# Patient Record
Sex: Female | Born: 1975 | Race: White | Hispanic: No | Marital: Single
Health system: Southern US, Community
[De-identification: ages and names within clinical notes are randomized; demographics above are authoritative.]

---

## 2016-03-06 ENCOUNTER — Other Ambulatory Visit: Payer: Self-pay | Admitting: Obstetrics and Gynecology

## 2016-03-06 DIAGNOSIS — Z1231 Encounter for screening mammogram for malignant neoplasm of breast: Secondary | ICD-10-CM

## 2016-04-17 ENCOUNTER — Ambulatory Visit
Admission: RE | Admit: 2016-04-17 | Discharge: 2016-04-17 | Disposition: A | Discharge status: Home / Self Care | Payer: Managed Care, Other (non HMO) | Source: Ambulatory Visit | Attending: Obstetrics and Gynecology | Admitting: Obstetrics and Gynecology

## 2016-04-17 DIAGNOSIS — Z1231 Encounter for screening mammogram for malignant neoplasm of breast: Secondary | ICD-10-CM | POA: Diagnosis present

## 2017-03-29 ENCOUNTER — Other Ambulatory Visit: Payer: Self-pay | Admitting: Obstetrics and Gynecology

## 2017-03-29 DIAGNOSIS — Z1231 Encounter for screening mammogram for malignant neoplasm of breast: Secondary | ICD-10-CM

## 2017-04-24 ENCOUNTER — Ambulatory Visit
Admission: RE | Admit: 2017-04-24 | Discharge: 2017-04-24 | Disposition: A | Discharge status: Home / Self Care | Payer: Managed Care, Other (non HMO) | Source: Ambulatory Visit | Attending: Obstetrics and Gynecology | Admitting: Obstetrics and Gynecology

## 2017-04-24 DIAGNOSIS — Z1231 Encounter for screening mammogram for malignant neoplasm of breast: Secondary | ICD-10-CM | POA: Diagnosis present

## 2018-03-24 ENCOUNTER — Other Ambulatory Visit: Payer: Self-pay | Admitting: Obstetrics and Gynecology

## 2018-03-24 DIAGNOSIS — Z1231 Encounter for screening mammogram for malignant neoplasm of breast: Secondary | ICD-10-CM

## 2018-04-28 ENCOUNTER — Ambulatory Visit
Admission: RE | Admit: 2018-04-28 | Discharge: 2018-04-28 | Disposition: A | Discharge status: Home / Self Care | Payer: Managed Care, Other (non HMO) | Source: Ambulatory Visit | Attending: Obstetrics and Gynecology | Admitting: Obstetrics and Gynecology

## 2018-04-28 DIAGNOSIS — Z1231 Encounter for screening mammogram for malignant neoplasm of breast: Secondary | ICD-10-CM | POA: Diagnosis not present

## 2019-06-30 ENCOUNTER — Other Ambulatory Visit: Payer: Self-pay | Admitting: Internal Medicine

## 2019-06-30 ENCOUNTER — Other Ambulatory Visit: Payer: Self-pay | Admitting: Certified Nurse Midwife

## 2019-06-30 DIAGNOSIS — Z1231 Encounter for screening mammogram for malignant neoplasm of breast: Secondary | ICD-10-CM

## 2019-07-20 ENCOUNTER — Ambulatory Visit
Admission: RE | Admit: 2019-07-20 | Discharge: 2019-07-20 | Disposition: A | Discharge status: Home / Self Care | Payer: 59 | Source: Ambulatory Visit | Attending: Certified Nurse Midwife | Admitting: Certified Nurse Midwife

## 2019-07-20 DIAGNOSIS — Z1231 Encounter for screening mammogram for malignant neoplasm of breast: Secondary | ICD-10-CM

## 2020-04-28 ENCOUNTER — Other Ambulatory Visit: Payer: Self-pay

## 2020-04-28 DIAGNOSIS — Z1231 Encounter for screening mammogram for malignant neoplasm of breast: Secondary | ICD-10-CM

## 2020-07-20 ENCOUNTER — Other Ambulatory Visit: Payer: Self-pay

## 2020-07-20 ENCOUNTER — Ambulatory Visit
Admission: RE | Admit: 2020-07-20 | Discharge: 2020-07-20 | Disposition: A | Discharge status: Home / Self Care | Payer: Managed Care, Other (non HMO) | Source: Ambulatory Visit

## 2020-07-20 DIAGNOSIS — Z1231 Encounter for screening mammogram for malignant neoplasm of breast: Secondary | ICD-10-CM | POA: Insufficient documentation

## 2021-01-20 ENCOUNTER — Other Ambulatory Visit: Payer: Self-pay | Admitting: Internal Medicine

## 2021-01-20 ENCOUNTER — Other Ambulatory Visit (HOSPITAL_BASED_OUTPATIENT_CLINIC_OR_DEPARTMENT_OTHER): Payer: Self-pay | Admitting: Internal Medicine

## 2021-01-20 DIAGNOSIS — R3129 Other microscopic hematuria: Secondary | ICD-10-CM

## 2021-02-03 ENCOUNTER — Ambulatory Visit: Payer: Managed Care, Other (non HMO)

## 2021-12-09 ENCOUNTER — Encounter: Payer: Self-pay | Admitting: Emergency Medicine

## 2021-12-09 ENCOUNTER — Emergency Department: Payer: BC Managed Care – PPO

## 2021-12-09 ENCOUNTER — Emergency Department: Payer: BC Managed Care – PPO | Admitting: Anesthesiology

## 2021-12-09 ENCOUNTER — Ambulatory Visit
Admission: EM | Admit: 2021-12-09 | Discharge: 2021-12-09 | Disposition: A | Discharge status: Home / Self Care | Payer: BC Managed Care – PPO | Attending: Specialist | Admitting: Specialist

## 2021-12-09 ENCOUNTER — Encounter
Admission: EM | Disposition: A | Discharge status: Home / Self Care | Payer: Self-pay | Source: Home / Self Care | Attending: Emergency Medicine

## 2021-12-09 ENCOUNTER — Other Ambulatory Visit: Payer: Self-pay

## 2021-12-09 DIAGNOSIS — Y9389 Activity, other specified: Secondary | ICD-10-CM | POA: Diagnosis not present

## 2021-12-09 DIAGNOSIS — S52572A Other intraarticular fracture of lower end of left radius, initial encounter for closed fracture: Secondary | ICD-10-CM | POA: Insufficient documentation

## 2021-12-09 DIAGNOSIS — S52502A Unspecified fracture of the lower end of left radius, initial encounter for closed fracture: Secondary | ICD-10-CM

## 2021-12-09 DIAGNOSIS — Z881 Allergy status to other antibiotic agents status: Secondary | ICD-10-CM | POA: Insufficient documentation

## 2021-12-09 DIAGNOSIS — W1839XA Other fall on same level, initial encounter: Secondary | ICD-10-CM | POA: Insufficient documentation

## 2021-12-09 DIAGNOSIS — S52612A Displaced fracture of left ulna styloid process, initial encounter for closed fracture: Secondary | ICD-10-CM | POA: Diagnosis not present

## 2021-12-09 DIAGNOSIS — Y9239 Other specified sports and athletic area as the place of occurrence of the external cause: Secondary | ICD-10-CM | POA: Insufficient documentation

## 2021-12-09 DIAGNOSIS — W19XXXA Unspecified fall, initial encounter: Secondary | ICD-10-CM

## 2021-12-09 HISTORY — PX: OPEN REDUCTION INTERNAL FIXATION (ORIF) DISTAL RADIAL FRACTURE: SHX5989

## 2021-12-09 LAB — CBC WITH DIFFERENTIAL/PLATELET
Abs Immature Granulocytes: 0.07 10*3/uL (ref 0.00–0.07)
Basophils Absolute: 0.1 10*3/uL (ref 0.0–0.1)
Basophils Relative: 1 %
Eosinophils Absolute: 0 10*3/uL (ref 0.0–0.5)
Eosinophils Relative: 0 %
HCT: 43.8 % (ref 36.0–46.0)
Hemoglobin: 14.6 g/dL (ref 12.0–15.0)
Immature Granulocytes: 1 %
Lymphocytes Relative: 8 %
Lymphs Abs: 1.2 10*3/uL (ref 0.7–4.0)
MCH: 28.7 pg (ref 26.0–34.0)
MCHC: 33.3 g/dL (ref 30.0–36.0)
MCV: 86.1 fL (ref 80.0–100.0)
Monocytes Absolute: 0.2 10*3/uL (ref 0.1–1.0)
Monocytes Relative: 1 %
Neutro Abs: 13.1 10*3/uL — ABNORMAL HIGH (ref 1.7–7.7)
Neutrophils Relative %: 89 %
Platelets: 282 10*3/uL (ref 150–400)
RBC: 5.09 MIL/uL (ref 3.87–5.11)
RDW: 11.9 % (ref 11.5–15.5)
WBC: 14.6 10*3/uL — ABNORMAL HIGH (ref 4.0–10.5)
nRBC: 0 % (ref 0.0–0.2)

## 2021-12-09 LAB — BASIC METABOLIC PANEL
Anion gap: 10 (ref 5–15)
BUN: 24 mg/dL — ABNORMAL HIGH (ref 6–20)
CO2: 23 mmol/L (ref 22–32)
Calcium: 9.3 mg/dL (ref 8.9–10.3)
Chloride: 106 mmol/L (ref 98–111)
Creatinine, Ser: 0.9 mg/dL (ref 0.44–1.00)
GFR, Estimated: >60 mL/min (ref >60)
Glucose, Bld: 102 mg/dL — ABNORMAL HIGH (ref 70–99)
Potassium: 3.6 mmol/L (ref 3.5–5.1)
Sodium: 139 mmol/L (ref 135–145)

## 2021-12-09 SURGERY — OPEN REDUCTION INTERNAL FIXATION (ORIF) DISTAL RADIUS FRACTURE
Anesthesia: General | Site: Wrist | Laterality: Left

## 2021-12-09 MED ORDER — ONDANSETRON HCL 4 MG/2ML IJ SOLN
4.0000 mg | Freq: Once | INTRAMUSCULAR | Status: AC
  Administered 2021-12-09: 4 mg via INTRAVENOUS
  Filled 2021-12-09 (×2): qty 2

## 2021-12-09 MED ORDER — FENTANYL CITRATE (PF) 100 MCG/2ML IJ SOLN: 25.0000 ug | INTRAMUSCULAR | Status: DC | PRN

## 2021-12-09 MED ORDER — BUPIVACAINE HCL (PF) 0.5 % IJ SOLN
INTRAMUSCULAR | Status: DC | PRN
  Administered 2021-12-09: 30 mL

## 2021-12-09 MED ORDER — FENTANYL CITRATE (PF) 100 MCG/2ML IJ SOLN
INTRAMUSCULAR | Status: AC
  Filled 2021-12-09: qty 2

## 2021-12-09 MED ORDER — SODIUM CHLORIDE 0.9 % IR SOLN: Status: DC | PRN

## 2021-12-09 MED ORDER — ACETAMINOPHEN 10 MG/ML IV SOLN
INTRAVENOUS | Status: DC | PRN
  Administered 2021-12-09: 1000 mg via INTRAVENOUS

## 2021-12-09 MED ORDER — LACTATED RINGERS IV SOLN: INTRAVENOUS | Status: DC | PRN

## 2021-12-09 MED ORDER — DEXMEDETOMIDINE (PRECEDEX) IN NS 20 MCG/5ML (4 MCG/ML) IV SYRINGE
PREFILLED_SYRINGE | INTRAVENOUS | Status: DC | PRN
  Administered 2021-12-09 (×3): 4 ug via INTRAVENOUS

## 2021-12-09 MED ORDER — MELOXICAM 15 MG PO TABS: 15.0000 mg | ORAL_TABLET | Freq: Every day | ORAL | 3 refills | Status: AC

## 2021-12-09 MED ORDER — DEXAMETHASONE SODIUM PHOSPHATE 10 MG/ML IJ SOLN
INTRAMUSCULAR | Status: AC
  Filled 2021-12-09: qty 1

## 2021-12-09 MED ORDER — SEVOFLURANE IN SOLN
RESPIRATORY_TRACT | Status: AC
  Filled 2021-12-09: qty 250

## 2021-12-09 MED ORDER — HYDROCODONE-ACETAMINOPHEN 5-325 MG PO TABS: 1.0000 | ORAL_TABLET | ORAL | Status: DC | PRN

## 2021-12-09 MED ORDER — GABAPENTIN 400 MG PO CAPS: 400.0000 mg | ORAL_CAPSULE | ORAL | Status: DC

## 2021-12-09 MED ORDER — CEFAZOLIN SODIUM-DEXTROSE 2-4 GM/100ML-% IV SOLN
2.0000 g | INTRAVENOUS | Status: AC
  Administered 2021-12-09: 2 g via INTRAVENOUS

## 2021-12-09 MED ORDER — LIDOCAINE HCL (CARDIAC) PF 100 MG/5ML IV SOSY
PREFILLED_SYRINGE | INTRAVENOUS | Status: DC | PRN
  Administered 2021-12-09: 80 mg via INTRAVENOUS

## 2021-12-09 MED ORDER — ONDANSETRON HCL 4 MG/2ML IJ SOLN
INTRAMUSCULAR | Status: DC | PRN
  Administered 2021-12-09: 4 mg via INTRAVENOUS

## 2021-12-09 MED ORDER — ACETAMINOPHEN 10 MG/ML IV SOLN
INTRAVENOUS | Status: AC
  Filled 2021-12-09: qty 100

## 2021-12-09 MED ORDER — GABAPENTIN 400 MG PO CAPS: 400.0000 mg | ORAL_CAPSULE | Freq: Three times a day (TID) | ORAL | 3 refills | Status: AC

## 2021-12-09 MED ORDER — DEXAMETHASONE SODIUM PHOSPHATE 10 MG/ML IJ SOLN
INTRAMUSCULAR | Status: DC | PRN
  Administered 2021-12-09: 10 mg via INTRAVENOUS

## 2021-12-09 MED ORDER — MIDAZOLAM HCL 2 MG/2ML IJ SOLN
INTRAMUSCULAR | Status: DC | PRN
  Administered 2021-12-09: 2 mg via INTRAVENOUS

## 2021-12-09 MED ORDER — ONDANSETRON HCL 4 MG/2ML IJ SOLN
INTRAMUSCULAR | Status: AC
  Filled 2021-12-09: qty 2

## 2021-12-09 MED ORDER — MELOXICAM 7.5 MG PO TABS: 15.0000 mg | ORAL_TABLET | ORAL | Status: DC

## 2021-12-09 MED ORDER — MORPHINE SULFATE (PF) 4 MG/ML IV SOLN
4.0000 mg | Freq: Once | INTRAVENOUS | Status: AC
  Administered 2021-12-09: 4 mg via INTRAVENOUS
  Filled 2021-12-09 (×2): qty 1

## 2021-12-09 MED ORDER — MIDAZOLAM HCL 2 MG/2ML IJ SOLN
INTRAMUSCULAR | Status: AC
  Filled 2021-12-09: qty 2

## 2021-12-09 MED ORDER — FENTANYL CITRATE (PF) 100 MCG/2ML IJ SOLN
INTRAMUSCULAR | Status: DC | PRN
  Administered 2021-12-09: 25 ug via INTRAVENOUS
  Administered 2021-12-09 (×3): 50 ug via INTRAVENOUS
  Administered 2021-12-09: 25 ug via INTRAVENOUS

## 2021-12-09 MED ORDER — PROPOFOL 10 MG/ML IV BOLUS
INTRAVENOUS | Status: AC
  Filled 2021-12-09: qty 20

## 2021-12-09 MED ORDER — PROPOFOL 10 MG/ML IV BOLUS
INTRAVENOUS | Status: DC | PRN
  Administered 2021-12-09: 140 mg via INTRAVENOUS

## 2021-12-09 MED ORDER — LIDOCAINE HCL (PF) 2 % IJ SOLN
INTRAMUSCULAR | Status: AC
  Filled 2021-12-09: qty 5

## 2021-12-09 MED ORDER — HYDROCODONE-ACETAMINOPHEN 5-325 MG PO TABS: 1.0000 | ORAL_TABLET | Freq: Four times a day (QID) | ORAL | 0 refills | Status: AC | PRN

## 2021-12-09 MED ORDER — ONDANSETRON HCL 4 MG/2ML IJ SOLN
4.0000 mg | Freq: Once | INTRAMUSCULAR | Status: DC | PRN
Start: 2021-12-09 — End: 2021-12-10

## 2021-12-09 MED ORDER — SODIUM CHLORIDE 0.9 % IV SOLN: INTRAVENOUS | Status: DC

## 2021-12-09 MED ORDER — MORPHINE SULFATE (PF) 2 MG/ML IV SOLN: 1.0000 mg | INTRAVENOUS | Status: DC | PRN

## 2021-12-09 SURGICAL SUPPLY — 52 items
BIT DRILL 2 FAST STEP (BIT) IMPLANT
BIT DRILL 2.5X4 QC (BIT) IMPLANT
BLADE SURG MINI STRL (BLADE) ×1 IMPLANT
BNDG COHESIVE 4X5 TAN STRL LF (GAUZE/BANDAGES/DRESSINGS) IMPLANT
BNDG ESMARK 4X12 TAN STRL LF (GAUZE/BANDAGES/DRESSINGS) ×1 IMPLANT
CHLORAPREP W/TINT 26 (MISCELLANEOUS) ×1 IMPLANT
CUFF TOURN SGL QUICK 18X4 (TOURNIQUET CUFF) IMPLANT
DRAPE FLUOR MINI C-ARM 54X84 (DRAPES) ×1 IMPLANT
DRIVER PEG 2.0 FAST (BIT) IMPLANT
DRSG GAUZE FLUFF 36X18 (GAUZE/BANDAGES/DRESSINGS) ×1 IMPLANT
ELECT REM PT RETURN 9FT ADLT (ELECTROSURGICAL) ×1
ELECTRODE REM PT RTRN 9FT ADLT (ELECTROSURGICAL) ×1 IMPLANT
GAUZE SPONGE 4X4 12PLY STRL (GAUZE/BANDAGES/DRESSINGS) ×1 IMPLANT
GAUZE XEROFORM 1X8 LF (GAUZE/BANDAGES/DRESSINGS) ×1 IMPLANT
GLOVE SURG ORTHO 8.5 STRL (GLOVE) ×1 IMPLANT
GLOVE SURG UNDER LTX SZ8 (GLOVE) ×1 IMPLANT
GOWN STRL REUS W/ TWL LRG LVL3 (GOWN DISPOSABLE) ×1 IMPLANT
GOWN STRL REUS W/TWL LRG LVL3 (GOWN DISPOSABLE) ×1
GOWN STRL REUS W/TWL LRG LVL4 (GOWN DISPOSABLE) ×1 IMPLANT
K-WIRE 1.6 (WIRE) ×2
K-WIRE FX5X1.6XNS BN SS (WIRE) ×2
KIT TURNOVER KIT A (KITS) ×1 IMPLANT
KWIRE FX5X1.6XNS BN SS (WIRE) IMPLANT
MANIFOLD NEPTUNE II (INSTRUMENTS) ×1 IMPLANT
NDL SAFETY ECLIPSE 18X1.5 (NEEDLE) ×1 IMPLANT
NEEDLE HYPO 18GX1.5 SHARP (NEEDLE) ×1
NS IRRIG 500ML POUR BTL (IV SOLUTION) ×1 IMPLANT
PACK EXTREMITY ARMC (MISCELLANEOUS) ×1 IMPLANT
PADDING CAST BLEND 4X4 STRL (MISCELLANEOUS) ×2 IMPLANT
PEG SUBCHONDRAL SMOOTH 2.0X14 (Peg) IMPLANT
PEG SUBCHONDRAL SMOOTH 2.0X16 (Peg) IMPLANT
PEG SUBCHONDRAL SMOOTH 2.0X18 (Peg) IMPLANT
PLATE SHORT 24.4X51.3 LT (Plate) IMPLANT
SCREW BN 12X3.5XNS CORT TI (Screw) IMPLANT
SCREW CORT 3.5X12 (Screw) ×1 IMPLANT
SCREW CORT 3.5X14 LNG (Screw) IMPLANT
SCREW PEG LOCK 2.5X18 (Peg) IMPLANT
SPLINT CAST 1 STEP 3X12 (MISCELLANEOUS) ×1 IMPLANT
SPONGE T-LAP 18X18 ~~LOC~~+RFID (SPONGE) ×1 IMPLANT
STAPLER SKIN PROX 35W (STAPLE) ×1 IMPLANT
STOCKINETTE 48X4 2 PLY STRL (GAUZE/BANDAGES/DRESSINGS) ×1 IMPLANT
STOCKINETTE BIAS CUT 4 980044 (GAUZE/BANDAGES/DRESSINGS) ×1 IMPLANT
STOCKINETTE STRL 4IN 9604848 (GAUZE/BANDAGES/DRESSINGS) ×1 IMPLANT
SUT VIC AB 2-0 SH 27 (SUTURE) ×1
SUT VIC AB 2-0 SH 27XBRD (SUTURE) ×1 IMPLANT
SUT VIC AB 3-0 PS2 18 (SUTURE) ×1 IMPLANT
SUT VIC AB 3-0 SH 27 (SUTURE) ×1
SUT VIC AB 3-0 SH 27X BRD (SUTURE) ×1 IMPLANT
SUT VIC AB 4-0 SH 27 (SUTURE) ×1
SUT VIC AB 4-0 SH 27XANBCTRL (SUTURE) IMPLANT
TRAP FLUID SMOKE EVACUATOR (MISCELLANEOUS) ×1 IMPLANT
WATER STERILE IRR 500ML POUR (IV SOLUTION) ×1 IMPLANT

## 2021-12-09 NOTE — ED Triage Notes (Signed)
Pt reports was at the gym playing tug of war and tripped over a weight and fell hurting her left wrist.

## 2021-12-09 NOTE — Anesthesia Procedure Notes (Signed)
Procedure Name: LMA Insertion Date/Time: 12/09/2021 4:44 PM  Performed by: Derenda Mis, CRNAPre-anesthesia Checklist: Patient identified, Emergency Drugs available, Suction available and Patient being monitored Patient Re-evaluated:Patient Re-evaluated prior to induction Oxygen Delivery Method: Circle system utilized Preoxygenation: Pre-oxygenation with 100% oxygen Induction Type: IV induction Ventilation: Mask ventilation without difficulty LMA: LMA inserted LMA Size: 3.0 Number of attempts: 1 Placement Confirmation: positive ETCO2 Tube secured with: Tape Dental Injury: Teeth and Oropharynx as per pre-operative assessment

## 2021-12-09 NOTE — ED Notes (Signed)
Patient's belongings including cell phone were placed in a belongings bag and sent home with her mother.

## 2021-12-09 NOTE — ED Notes (Signed)
This RN and Ginny EMT attempted IV insertion x 3 without success, IV team consult placed.

## 2021-12-09 NOTE — ED Provider Notes (Signed)
[facility] Provider Note    Event Date/Time   First MD Initiated Contact with Patient 12/09/21 1156     (approximate)   History   Fall and Wrist Pain   HPI  Tracey Bates is a 46 y.o. female with no significant past medical history presents emergency department after a fall while working out at the gym.  States she fell back and landed on a weight.  Patient is right-handed.  No other injuries reported.  No LOC.  No numbness or tingling      Physical Exam   Triage Vital Signs: ED Triage Vitals  Enc Vitals Group     BP 12/09/21 1017 116/73     Pulse Rate 12/09/21 1017 67     Resp 12/09/21 1017 16     Temp 12/09/21 1017 98.6 F (37 C)     Temp Source 12/09/21 1017 Oral     SpO2 12/09/21 1017 98 %     Weight 12/09/21 1008 145 lb (65.8 kg)     Height 12/09/21 1008 5\' 2"  (1.575 m)     Head Circumference --      Peak Flow --      Pain Score 12/09/21 1008 6     Pain Loc --      Pain Edu? --      Excl. in GC? --     Most recent vital signs: Vitals:   12/09/21 1017  BP: 116/73  Pulse: 67  Resp: 16  Temp: 98.6 F (37 C)  SpO2: 98%     General: Awake, no distress.   CV:  Good peripheral perfusion. regular rate and  rhythm Resp:  Normal effort Abd:  No distention.   Other:  Deformity noted to the left wrist, neurovascular is intact   ED Results / Procedures / Treatments   Labs (all labs ordered are listed, but only abnormal results are displayed) Labs Reviewed  CBC WITH DIFFERENTIAL/PLATELET  BASIC METABOLIC PANEL     EKG     RADIOLOGY X-ray of the left wrist    PROCEDURES:   Procedures   MEDICATIONS ORDERED IN ED: Medications  morphine (PF) 4 MG/ML injection 4 mg (has no administration in time range)  ondansetron (ZOFRAN) injection 4 mg (has no administration in time range)  ceFAZolin (ANCEF) IVPB 2g/100 mL premix (has no administration in time range)  0.9 %  sodium chloride infusion (has no administration in  time range)  morphine (PF) 2 MG/ML injection 1 mg (has no administration in time range)     IMPRESSION / MDM / ASSESSMENT AND PLAN / ED COURSE  I reviewed the triage vital signs and the nursing notes.                              Differential diagnosis includes, but is not limited to, fracture, contusion, sprain  Patient's presentation is most consistent with acute complicated illness / injury requiring diagnostic workup.   X-ray of the left wrist independently reviewed and interpreted by me as having a comminuted displaced fracture of distal radius and displaced distal ulnar styloid process   Consult to orthopedics, Dr. 12/11/21 will be taking patient to the OR today.  She is placed in sugar-tong OCL.  She was given IV, morphine 4 mg IV for pain, Zofran 4 mg for nausea if needed.  She is to stay n.p.o.  Basic labs were ordered.     FINAL  CLINICAL IMPRESSION(S) / ED DIAGNOSES   Final diagnoses:  Closed displaced fracture of styloid process of left ulna, initial encounter  Closed fracture of distal end of left radius, unspecified fracture morphology, initial encounter  Fall, initial encounter     Rx / DC Orders   ED Discharge Orders     None        Note:  This document was prepared using Dragon voice recognition software and may include unintentional dictation errors.    Faythe Ghee, PA-C 12/09/21 1311    Concha Se, MD 12/10/21 [phone]

## 2021-12-09 NOTE — H&P (Signed)
PREOPERATIVE H&P  Chief Complaint: Left wrist fracture, comminuted and displaced  HPI: Tracey Bates is a 46 y.o. female who presents for preoperative history and physical with a diagnosis of comminuted, displaced left wrist fracture.  She fell at the gym this morning and landed on her outstretched left wrist.  She had pain and deformity immediately and came to the emergency room at [facility].  Exam and x-rays revealed a displaced subacute displaced comminuted left distal radius fracture with ulnar styloid fracture.  Patient is young Immunologist.  Advised the patient I do not feel she would be satisfied with "attempt to close reduction internal fixation closed reduction and casting with this much of comminution and deformity.  I recommended open reduction internal fixation of the fracture and she agrees.  Risks and benefits and postop protocol were discussed with her at length.  I believe she can go home after surgery today.  Symptoms are rated as moderate to severe, and have been worsening.   Her health is good.  She is allergic to Cipro.  She denies any significant medical issues.  History reviewed. No pertinent past medical history. History reviewed. No pertinent surgical history. Social History   Socioeconomic History   Marital status: Single    Spouse name: Not on file   Number of children: Not on file   Years of education: Not on file   Highest education level: Not on file  Occupational History   Not on file  Tobacco Use   Smoking status: Not on file   Smokeless tobacco: Not on file  Substance and Sexual Activity   Alcohol use: Not on file   Drug use: Not on file   Sexual activity: Not on file  Other Topics Concern   Not on file  Social History Narrative   Not on file   Social Determinants of Health   Financial Resource Strain: Not on file  Food Insecurity: Not on file  Transportation Needs: Not on file  Physical Activity: Not on file  Stress: Not on file  Social Connections:  Not on file   Family History  Problem Relation Age of Onset   Breast cancer Neg Hx    Not on File Prior to Admission medications   Not on File     Positive ROS: All other systems have been reviewed and were otherwise negative with the exception of those mentioned in the HPI and as above.  Physical Exam: General: Alert, no acute distress Cardiovascular: No pedal edema. Heart is regular and without murmur.  Respiratory: No cyanosis, no use of accessory musculature. Lungs are clear. GI: No organomegaly, abdomen is soft and non-tender Skin: No lesions in the area of chief complaint Neurologic: Sensation intact distally Psychiatric: Patient is competent for consent with normal mood and affect Lymphatic: No axillary or cervical lymphadenopathy  MUSCULOSKELETAL: The left wrist is swollen and deformed.  The skin is intact.  Sensations intact.  Neurovascular status intact.  No other orthopedic injuries noted.  Assessment: Left wrist fracture, comminuted and displaced  Plan: Plan for Procedure(s): OPEN REDUCTION INTERNAL FIXATION (ORIF) DISTAL RADIUS FRACTURE  The risks benefits and alternatives were discussed with the patient including but not limited to the risks of nonoperative treatment, versus surgical intervention including infection, bleeding, nerve injury,  blood clots, cardiopulmonary complications, morbidity, mortality, among others, and they were willing to proceed.   Valinda Hoar, MD [phone]   12/09/2021 2:02 PM

## 2021-12-09 NOTE — H&P (Signed)
THE PATIENT WAS SEEN PRIOR TO SURGERY TODAY.  HISTORY, ALLERGIES, HOME MEDICATIONS AND OPERATIVE PROCEDURE WERE REVIEWED. RISKS AND BENEFITS OF SURGERY DISCUSSED WITH PATIENT AGAIN.  NO CHANGES FROM INITIAL HISTORY AND PHYSICAL NOTED.    

## 2021-12-09 NOTE — Anesthesia Preprocedure Evaluation (Signed)
Anesthesia Evaluation  Patient identified by MRN, date of birth, ID band Patient awake    Reviewed: Allergy & Precautions, H&P , NPO status , Patient's Chart, lab work & pertinent test results, reviewed documented beta blocker date and time   Airway Mallampati: II  TM Distance: >3 FB Neck ROM: full    Dental  (+) Teeth Intact   Pulmonary neg pulmonary ROS,    Pulmonary exam normal        Cardiovascular Exercise Tolerance: Good negative cardio ROS Normal cardiovascular exam Rate:Normal     Neuro/Psych negative neurological ROS  negative psych ROS   GI/Hepatic negative GI ROS, Neg liver ROS,   Endo/Other  negative endocrine ROS  Renal/GU negative Renal ROS  negative genitourinary   Musculoskeletal   Abdominal   Peds  Hematology negative hematology ROS (+)   Anesthesia Other Findings   Reproductive/Obstetrics negative OB ROS                             Anesthesia Physical Anesthesia Plan  ASA: 2 and emergent  Anesthesia Plan: General LMA   Post-op Pain Management:    Induction:   PONV Risk Score and Plan:   Airway Management Planned:   Additional Equipment:   Intra-op Plan:   Post-operative Plan:   Informed Consent: I have reviewed the patients History and Physical, chart, labs and discussed the procedure including the risks, benefits and alternatives for the proposed anesthesia with the patient or authorized representative who has indicated his/her understanding and acceptance.       Plan Discussed with: CRNA  Anesthesia Plan Comments:         Anesthesia Quick Evaluation

## 2021-12-09 NOTE — Op Note (Signed)
12/09/2021  6:27 PM  PATIENT:  Tracey Bates    PRE-OPERATIVE DIAGNOSIS:  Left wrist fracture-comminuted displaced distal radius with ulnar styloid  POST-OPERATIVE DIAGNOSIS:  Same  PROCEDURE:  OPEN REDUCTION INTERNAL FIXATION (ORIF) DISTAL RADIUS FRACTURE left  SURGEON:  Valinda Hoar, MD  TOURNIQUET TIME: 35 MIN  ANESTHESIA:   General  PREOPERATIVE INDICATIONS:  Tracey Bates is a  46 y.o. female with a diagnosis of Left wrist fracture who failed conservative measures and elected for surgical management.    The risks benefits and alternatives were discussed with the patient preoperatively including but not limited to the risks of infection, bleeding, nerve injury, malunion, nonunion, wrist stiffness, persistent wrist pain, osteoarthritis and the need for further surgery. Medical risks include but are not limited to DVT and pulmonary embolism, myocardial infarction, stroke, pneumonia, respiratory failure and death. Patient  understood these risks and wished to proceed.   OPERATIVE IMPLANTS: [employer] hand innovations plate, 4 hole  OPERATIVE FINDINGS: Very comminuted displaced fracture of the left distal radius  OPERATIVE PROCEDURE: Patient was seen in the preoperative area. I marked the operative hand with the word yes and my initials according the hospital's correct site of surgery protocol. Patient was then brought to the operating roomand was placed supine on the operative table and underwent general anesthesia with an LMA.   The operative arm was prepped and draped in a sterile fashion. A timeout performed to verify the patient's name, date of birth, medical record number, correct site of surgery correct procedure to be performed. The timeout was also used a timeout to verify patient received antibiotics and appropriate instruments, implants and radiographs studies were available in the room. Once all in attendance were in agreement case began.   Patient then had the operative  extremity exsanguinated with an Esmarch. The tourniquet was placed on the upper extremity and inflated 250 mm.  A manual reduction of the fracture was performed. The fracture reduction was confirmed on FluoroScan imaging.  A linear incision was then made over the FCR tendon. The subcutaneous tissue was carefully dissected using Metzenbaum scissor and Adson pickup. Retractors were used to protect the radial artery and median nerve. The pronator quadratus was identified and incised and elevated off the volar surface of the distal radius. A 3  hole [employer] volar plate was then positioned on the under surface of the distal radius. It was held into position with a K wire. The position of the plate was confirmed on AP and lateral images. Once the plate was in good position a cortical screw was placed bicortically in the sliding hole. Attention was then turned to the distal pegs. The proximal row of pegs was placed first. Each individual peg hole was drilled and then measured with a depth gauge. The proximal row had to threaded pegs placed. The distal row was then drilled and smooth pegs were placed. The position and length of all screws were confirmed on AP and lateral FluoroScan imaging. Care was taken to avoid penetration of any peg through the articular surface of the distal radius.  Once all distal pegs were placed, the attention was turned back to placement of bicortical shaft screws. Additional screws were placed in the plate to fill the remaining holes. The wound was then copiously irrigated. Final FluoroScan imaging of the construct were taken. The fracture was in anatomic position and the hardware was well-positioned. The wound again was copiously irrigated. The soft tissue was then carefully over the plate. The tissues  were infiltrated with 1/2% marcaine.  The skin was closed with staples. Xeroform and a dry sterile dressing were applied along with a volar splint. I was scrubbed and present for the  entire case and all sharp and instrument counts were correct at the conclusion the case. The patient tolerated this procedure well and was awakened and taken to the recovery room in good condition.   Deeann Saint, MD

## 2021-12-09 NOTE — Transfer of Care (Signed)
Immediate Anesthesia Transfer of Care Note  Patient: Tracey Bates  Procedure(s) Performed: OPEN REDUCTION INTERNAL FIXATION (ORIF) DISTAL RADIUS FRACTURE (Left: Wrist)  Patient Location: PACU  Anesthesia Type:General  Level of Consciousness: drowsy  Airway & Oxygen Therapy: Patient connected to nasal cannula oxygen  Post-op Assessment: Report given to RN  Post vital signs: stable  Last Vitals:  Vitals Value Taken Time  BP 125/67 12/09/21 1825  Temp 37.4 C 12/09/21 1825  Pulse 77 12/09/21 1829  Resp 14 12/09/21 1829  SpO2 93 % 12/09/21 1829  Vitals shown include unvalidated device data.  Last Pain:  Vitals:   12/09/21 1825  TempSrc:   PainSc: Asleep         Complications: No notable events documented.

## 2021-12-09 NOTE — Discharge Instructions (Signed)
AMBULATORY SURGERY  ?DISCHARGE INSTRUCTIONS ? ? ?The drugs that you were given will stay in your system until tomorrow so for the next 24 hours you should not: ? ?Drive an automobile ?Make any legal decisions ?Drink any alcoholic beverage ? ? ?You may resume regular meals tomorrow.  Today it is better to start with liquids and gradually work up to solid foods. ? ?You may eat anything you prefer, but it is better to start with liquids, then soup and crackers, and gradually work up to solid foods. ? ? ?Please notify your doctor immediately if you have any unusual bleeding, trouble breathing, redness and pain at the surgery site, drainage, fever, or pain not relieved by medication. ? ? ? ?Additional Instructions: ? ? ? ?Please contact your physician with any problems or Same Day Surgery at [phone], Monday through Friday 6 am to 4 pm, or [place] [facility] [place] Main number at [phone].  ?

## 2021-12-11 ENCOUNTER — Encounter: Payer: Self-pay | Admitting: Specialist

## 2021-12-12 NOTE — Anesthesia Postprocedure Evaluation (Signed)
Anesthesia Post Note  Patient: Tracey Bates  Procedure(s) Performed: OPEN REDUCTION INTERNAL FIXATION (ORIF) DISTAL RADIUS FRACTURE (Left: Wrist)  Patient location during evaluation: PACU Anesthesia Type: General Level of consciousness: awake and alert Pain management: pain level controlled Vital Signs Assessment: post-procedure vital signs reviewed and stable Respiratory status: spontaneous breathing, nonlabored ventilation, respiratory function stable and patient connected to nasal cannula oxygen Cardiovascular status: blood pressure returned to baseline and stable Postop Assessment: no apparent nausea or vomiting Anesthetic complications: no   No notable events documented.   Last Vitals:  Vitals:   12/09/21 1830 12/09/21 1845  BP: 134/76 135/83  Pulse: 77 89  Resp: 14 20  Temp:  36.7 C  SpO2: 93% 95%    Last Pain:  Vitals:   12/09/21 1845  TempSrc:   PainSc: 4                  Yevette Edwards

## 2022-01-24 ENCOUNTER — Other Ambulatory Visit: Payer: Self-pay

## 2022-01-24 ENCOUNTER — Other Ambulatory Visit: Payer: Self-pay | Admitting: Internal Medicine

## 2022-01-24 DIAGNOSIS — Z1231 Encounter for screening mammogram for malignant neoplasm of breast: Secondary | ICD-10-CM

## 2022-02-08 ENCOUNTER — Encounter: Payer: Self-pay | Admitting: Specialist

## 2022-02-27 ENCOUNTER — Ambulatory Visit
Admission: RE | Admit: 2022-02-27 | Discharge: 2022-02-27 | Disposition: A | Discharge status: Home / Self Care | Payer: BC Managed Care – PPO | Source: Ambulatory Visit | Attending: Internal Medicine | Admitting: Internal Medicine

## 2022-02-27 DIAGNOSIS — Z1231 Encounter for screening mammogram for malignant neoplasm of breast: Secondary | ICD-10-CM | POA: Diagnosis present

## 2022-07-22 IMAGING — MG MM DIGITAL SCREENING BILAT W/ TOMO AND CAD
6 of 10 series · 6 of 30 positions shown · non-contrast
Comparison: Previous exam(s).

CLINICAL DATA: Screening.

EXAM:
DIGITAL SCREENING BILATERAL MAMMOGRAM WITH TOMOSYNTHESIS AND CAD
TECHNIQUE: Bilateral screening digital craniocaudal and mediolateral oblique
mammograms were obtained. Bilateral screening digital breast
tomosynthesis was performed. The images were evaluated with
computer-aided detection.

[L MLO synth-2D (1 of 2)]
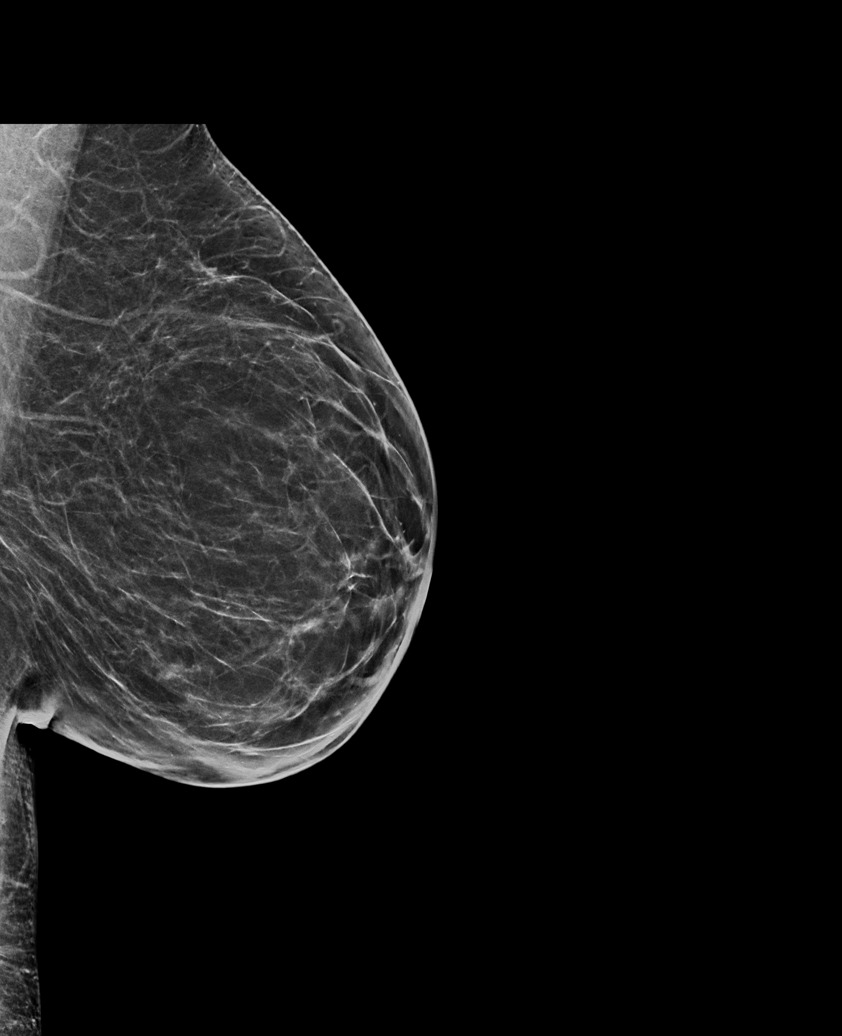

[L MLO synth-2D (2 of 2)]
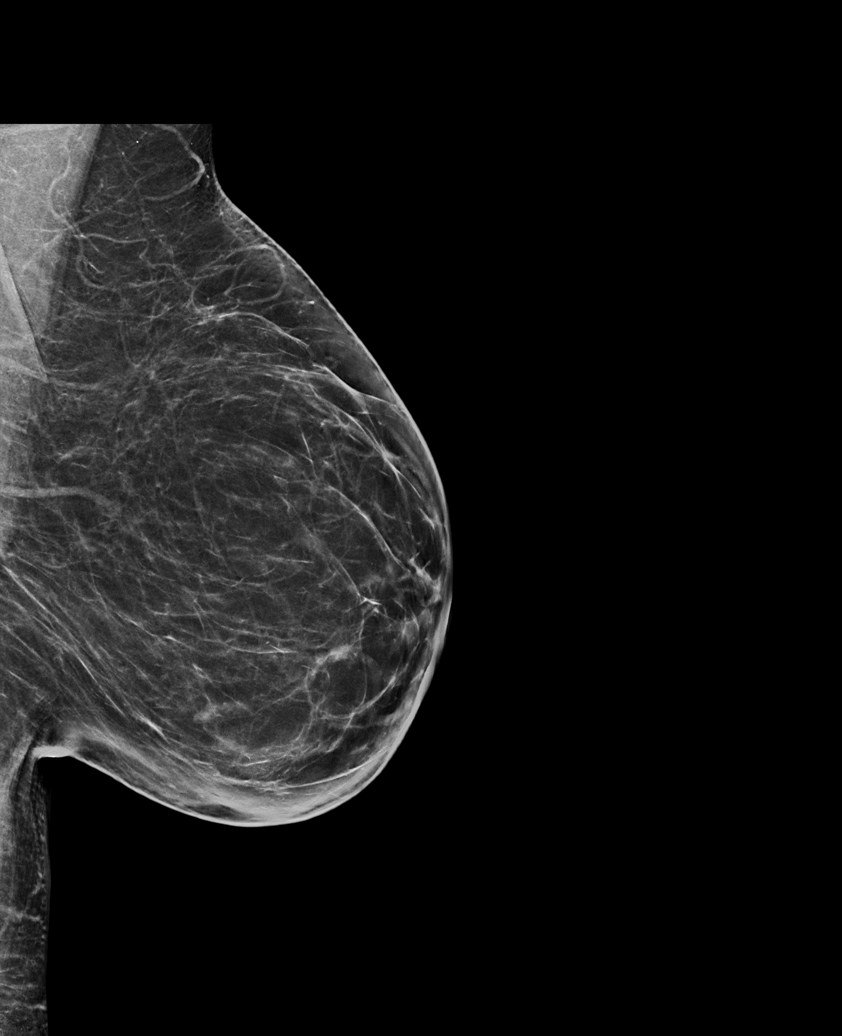

[R MLO synth-2D]
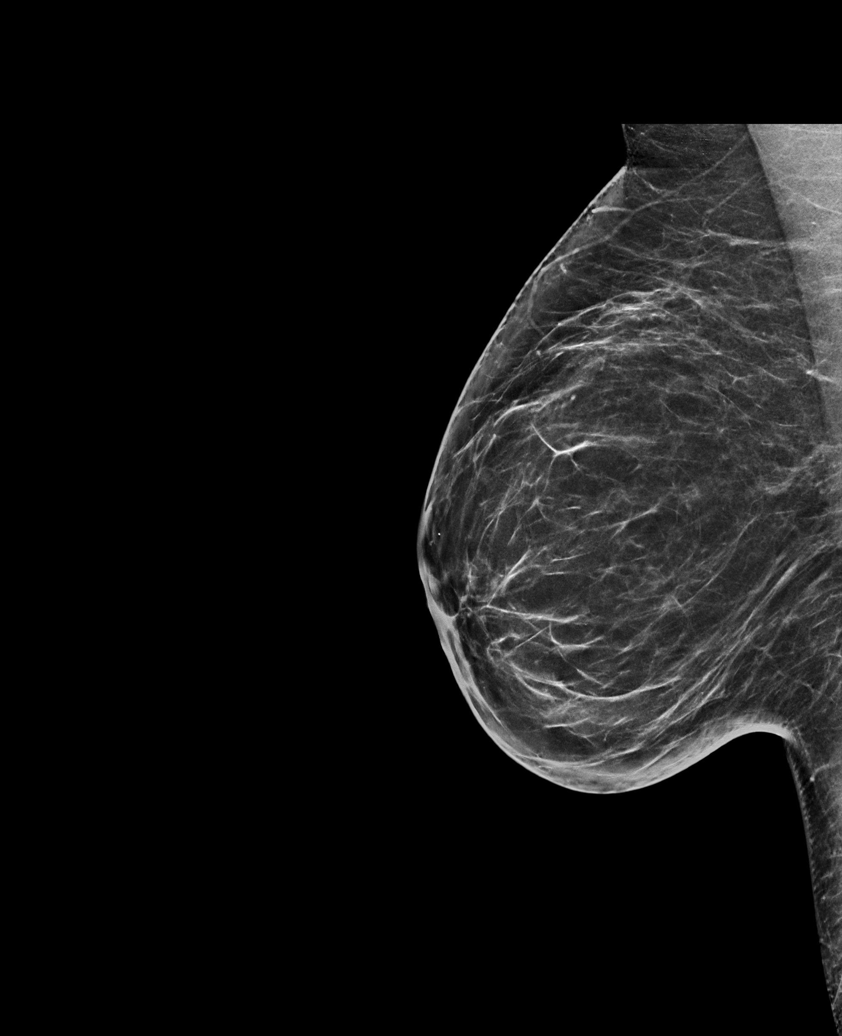

[R CC synth-2D]
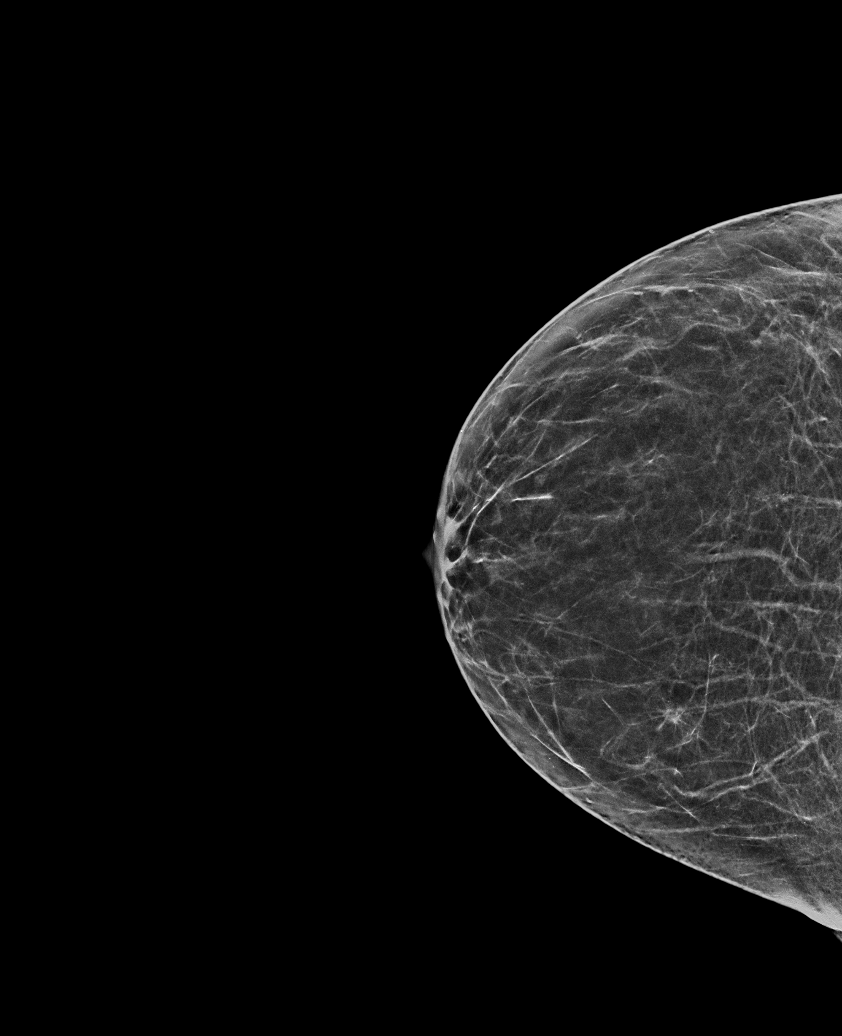

[L CC synth-2D]
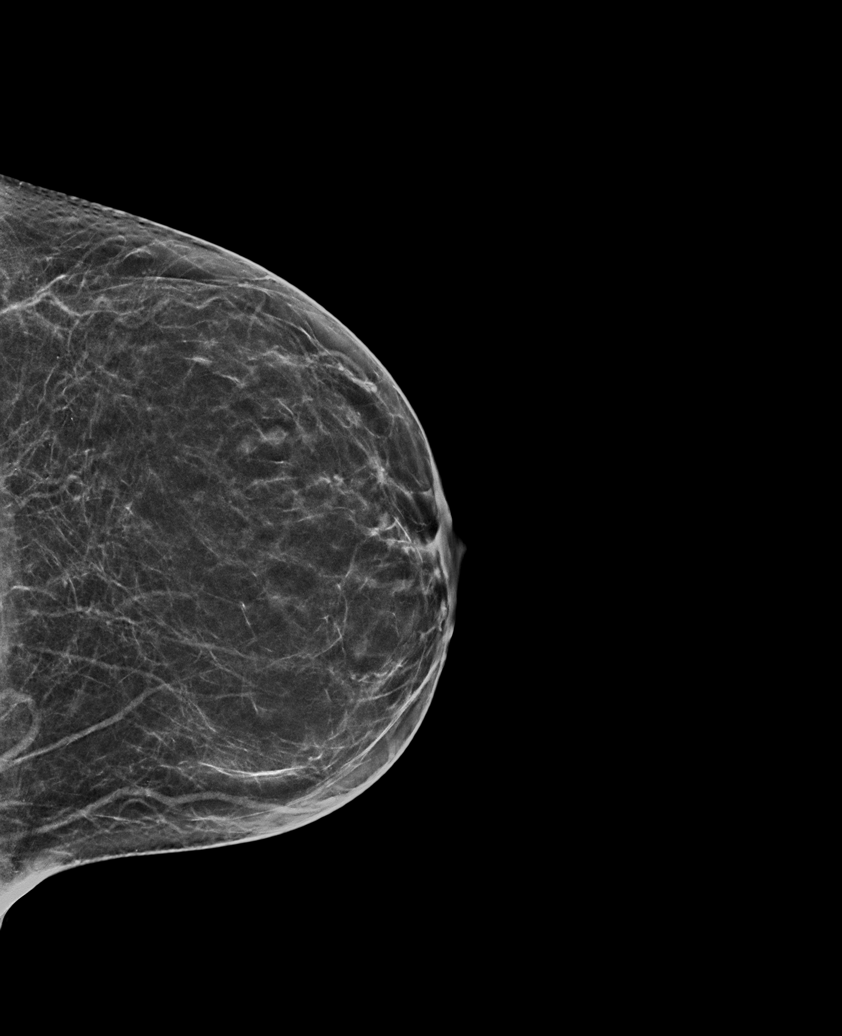

[L CC tomo · tomo slice 31/62.0]
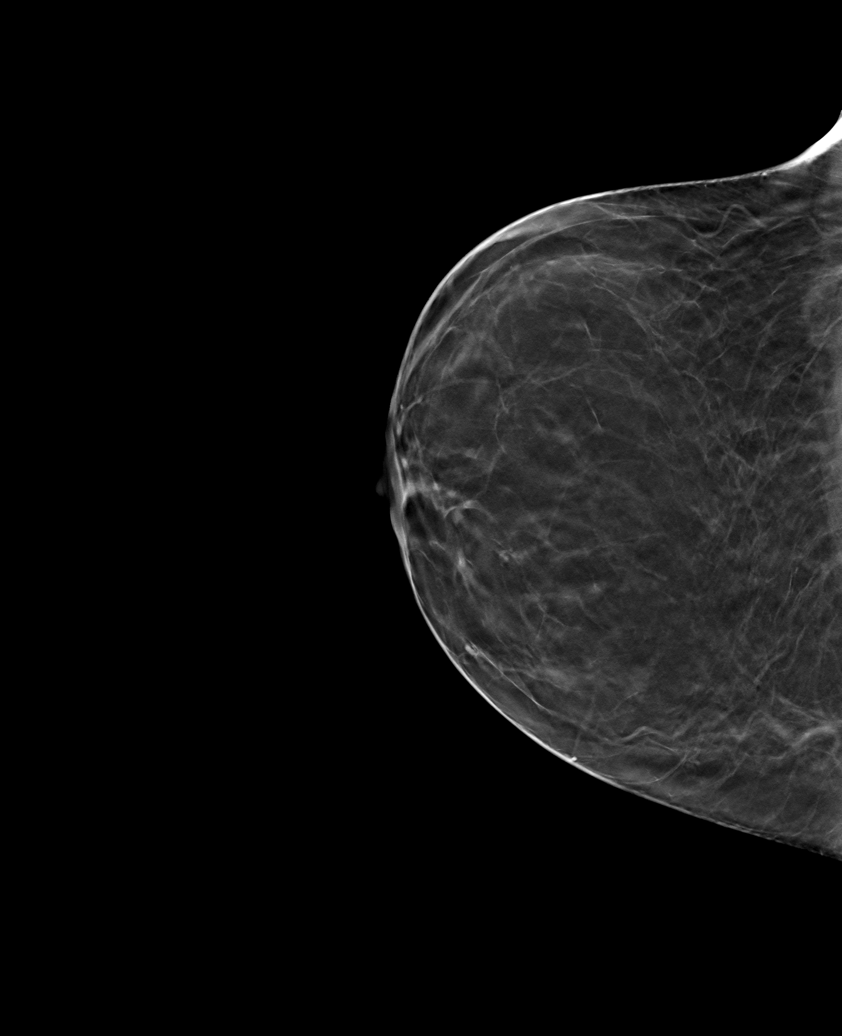

[6 of 30 positions shown; findings below may reference images not displayed]

ACR Breast Density Category b: There are scattered areas of
fibroglandular density.
FINDINGS: There are no findings suspicious for malignancy. The images were
evaluated with computer-aided detection.
IMPRESSION: No mammographic evidence of malignancy. A result letter of this
screening mammogram will be mailed directly to the patient.

RECOMMENDATION:
Screening mammogram in one year. (Code:WJ-I-BG6)

BI-RADS CATEGORY  1: Negative.

## 2023-06-28 LAB — EXTERNAL GENERIC LAB PROCEDURE: COLOGUARD: NEGATIVE

## 2023-07-02 ENCOUNTER — Other Ambulatory Visit: Payer: Self-pay | Admitting: Internal Medicine

## 2023-07-02 DIAGNOSIS — Z1231 Encounter for screening mammogram for malignant neoplasm of breast: Secondary | ICD-10-CM

## 2023-07-16 ENCOUNTER — Ambulatory Visit
Admission: RE | Admit: 2023-07-16 | Discharge: 2023-07-16 | Disposition: A | Discharge status: Home / Self Care | Source: Ambulatory Visit | Attending: Internal Medicine | Admitting: Internal Medicine

## 2023-07-16 DIAGNOSIS — Z1231 Encounter for screening mammogram for malignant neoplasm of breast: Secondary | ICD-10-CM | POA: Diagnosis present
